# Patient Record
Sex: Male | Born: 2000 | Race: Black or African American | Hispanic: No | Marital: Single | Smoking: Never smoker
Health system: Southern US, Community
[De-identification: ages and names within clinical notes are randomized; demographics above are authoritative.]

## PROBLEM LIST (undated history)

## (undated) DIAGNOSIS — J181 Lobar pneumonia, unspecified organism: Secondary | ICD-10-CM

## (undated) DIAGNOSIS — T7840XA Allergy, unspecified, initial encounter: Secondary | ICD-10-CM

## (undated) DIAGNOSIS — J309 Allergic rhinitis, unspecified: Secondary | ICD-10-CM

## (undated) DIAGNOSIS — K5900 Constipation, unspecified: Secondary | ICD-10-CM

## (undated) HISTORY — DX: Allergic rhinitis, unspecified: J30.9

## (undated) HISTORY — DX: Allergy, unspecified, initial encounter: T78.40XA

## (undated) HISTORY — DX: Lobar pneumonia, unspecified organism: J18.1

## (undated) HISTORY — PX: CIRCUMCISION: SUR203

## (undated) HISTORY — DX: Constipation, unspecified: K59.00

---
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
## 2020-01-14 DIAGNOSIS — Z20822 Contact with and (suspected) exposure to covid-19: Secondary | ICD-10-CM | POA: Diagnosis not present

## 2020-01-16 ENCOUNTER — Ambulatory Visit (INDEPENDENT_AMBULATORY_CARE_PROVIDER_SITE_OTHER): Payer: BC Managed Care – PPO | Admitting: Pediatrics

## 2020-01-16 ENCOUNTER — Other Ambulatory Visit: Payer: Self-pay

## 2020-01-16 DIAGNOSIS — U071 COVID-19: Secondary | ICD-10-CM

## 2020-01-20 ENCOUNTER — Encounter: Payer: Self-pay | Admitting: Pediatrics

## 2020-01-20 NOTE — Progress Notes (Signed)
I connected with  George Haney on 01/20/20 by audio enabled telemedicine application and verified that I am speaking with the correct person using two identifiers.   I discussed the limitations of evaluation and management by telemedicine. The patient expressed understanding and agreed to proceed.  Location: Patient: Home Physician: Office  Subjective:     Patient ID: George Haney, male   DOB: 2001/01/23, 19 y.o.   MRN: [id number]  Chief Complaint  Patient presents with  . Covid positive    HPI: Spoke with the patient who is home with the mother in regards to Covid positive results.  According to the patient, he began to have symptoms of Covid infection including absence of taste and smell on Wednesday of last week.  Mother states that they brought the patient home on Thursday of last week.  She states that he began to have fevers with T-max of 102 on Wednesday.  She states that the patient was tested for Covid on Saturday morning and he was positive.  The results came back as of Sunday.  He was tested at an outside [facility] with a PCR test.  According to the patient, he has had a sore throat and also a headache.  He also continues to have fevers.  Mother states the temperatures are at 101.  She states that she has to give him ibuprofen in order to decrease his fevers.  According to the mother, the Tylenol does not seem to be helping as much.  Denies any shortness of breath.  States his appetite is mildly decreased, however he is drinking well.  When speaking with the patient, he is alert and able to answer all of my questions.  Does not seem to be short of breath during our conversation.  He does play basketball in a college team.  Past Medical History:  Diagnosis Date  . Allergic rhinitis   . Allergy   . Constipation    Miralax prn  . LLL pneumonia 05/18/2006     Family History  Problem Relation Age of Onset  . Diabetes Maternal Grandmother   . Heart disease Maternal  Grandmother   . Hypertension Mother   . Hypertension Maternal Grandfather   . Cancer Paternal Grandmother        renal cell    Social History   Tobacco Use  . Smoking status: Never Smoker  . Smokeless tobacco: Never Used  Substance Use Topics  . Alcohol use: No   Social History   Social History Narrative   Lives at home with mother, father and younger brother.  Attends [employer], 12th grade.  Plays basketball.    Outpatient Encounter Medications as of 01/16/2020  Medication Sig  . Adapalene 0.3 % gel Apply to the affected area once a day sparingly before bedtime.  . meloxicam (MOBIC) 15 MG tablet Take 15 mg by mouth daily.   No facility-administered encounter medications on file as of 01/16/2020.    Patient has no known allergies.    ROS:  Apart from the symptoms reviewed above, there are no other symptoms referable to all systems reviewed.   Physical Examination   Wt Readings from Last 3 Encounters:  01/19/19 133 lb 4 oz (60.4 kg) (24 %, Z= -0.70)*  01/11/18 124 lb 4 oz (56.4 kg) (19 %, Z= -0.88)*  12/16/18 133 lb 8 oz (60.6 kg) (25 %, Z= -0.67)*   * Growth percentiles are based on CDC (Boys, 2-20 Years) data.   BP Readings  from Last 3 Encounters:  01/19/19 118/65  01/11/18 (!) 100/60 (5 %, Z = -1.62 /  20 %, Z = -0.83)*  04/11/15 100/64 (17 %, Z = -0.95 /  54 %, Z = 0.11)*   *BP percentiles are based on the 2017 AAP Clinical Practice Guideline for boys   There is no height or weight on file to calculate BMI. No height and weight on file for this encounter. Blood pressure percentiles are not available for patients who are 18 years or older. Pulse Readings from Last 3 Encounters:  01/19/19 70  01/11/18 70  04/11/15 76       Current Encounter SPO2  04/11/15 0913 99%      Unable to perform examination due to type of visit. No results found for: RAPSCRN   No results found.  No results found for this or any previous visit (from the  past 240 hour(s)).  No results found for this or any previous visit (from the past 48 hour(s)).  Assessment:  1. COVID-19     Plan:   1.  Discussed Covid symptoms at length with mother.  Mother asks if there are any other medications that can be given for headaches and sore throat.  Discussed at length with mother, that the headache and the sore throat are secondary to the Covid infection itself.  Discussed at length with mother, that she may use Tylenol for symptoms of the headache and sore throat.  Otherwise, according to the mother, the patient does well with ibuprofen as the Tylenol does not seem to work as well.  Discussed with her to make sure the patient is well-hydrated. 2.  Patient has not been immunized against coronavirus nor has the family.  Hopefully, the results of the positive Covid testing will be sent to the [facility] department.  However, discussed with mother that she may call the health department as well in order to determine what needs to be done in regards to the rest of the family especially testing and quarantine. 3.  Discussed at length with mother and patient the potential consequences of the coronavirus infection.  If the patient should have any shortness of breath, dizziness, chest pain, etc., he needs to be evaluated in the ER right away.  Discussed multisystem inflammatory response that can also occur after the Covid infection is over.  Also discussed cardiac issues that can be present also secondary to the coronavirus. 4.  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  Therefore discussed with the patient, he needs to discuss this with the coaches and the college as far as what is required for return to play. Spent 20 min on the phone with the patient in regards to discussion of coronavirus infection as well as the possible consequences. Also discussed with mother and patient, that he is 45 years of age and in college.  We had discussed in the past, that  the patient needs to establish care with an adult physician.  Mother understands and will do so. No orders of the defined types were placed in this encounter.

## 2020-01-23 ENCOUNTER — Ambulatory Visit: Payer: Self-pay

## 2020-01-31 ENCOUNTER — Ambulatory Visit: Payer: Self-pay | Admitting: Pediatrics

## 2020-04-26 DIAGNOSIS — Z20822 Contact with and (suspected) exposure to covid-19: Secondary | ICD-10-CM | POA: Diagnosis not present

## 2020-08-20 ENCOUNTER — Ambulatory Visit: Payer: BLUE CROSS/BLUE SHIELD | Admitting: Family Medicine

## 2020-09-12 ENCOUNTER — Other Ambulatory Visit: Payer: Self-pay

## 2020-09-12 ENCOUNTER — Ambulatory Visit: Payer: Medicaid Other | Admitting: Family Medicine

## 2020-09-12 ENCOUNTER — Ambulatory Visit (INDEPENDENT_AMBULATORY_CARE_PROVIDER_SITE_OTHER): Payer: Self-pay | Admitting: Family Medicine

## 2020-09-12 ENCOUNTER — Encounter: Payer: Self-pay | Admitting: Family Medicine

## 2020-09-12 VITALS — BP 100/62 | Ht 69.0 in | Wt 139.2 lb

## 2020-09-12 DIAGNOSIS — N62 Hypertrophy of breast: Secondary | ICD-10-CM

## 2020-09-12 DIAGNOSIS — M7652 Patellar tendinitis, left knee: Secondary | ICD-10-CM | POA: Insufficient documentation

## 2020-09-12 DIAGNOSIS — Z025 Encounter for examination for participation in sport: Secondary | ICD-10-CM | POA: Insufficient documentation

## 2020-09-12 NOTE — Patient Instructions (Signed)
Nice to meet you Please see the endocrinologist   Please send me a message in MyChart with any questions or updates.  Please see me back to start the shockwave therapy .   --Dr. Jordan Likes

## 2020-09-12 NOTE — Assessment & Plan Note (Signed)
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 -Counseled on home exercise therapy and supportive care. -Counseled on patellar strap. -Pursue shockwave

## 2020-09-12 NOTE — Progress Notes (Signed)
o Kaysin Brock - 20 y.o. male MRN [id number]  Date of birth: 11-15-2000  SUBJECTIVE:  Including CC & ROS.  No chief complaint on file.   Kalman Nylen is a 20 y.o. male that is here for a sports physical. He is having some left knee issues and enlarged nipples. The change in his nipples have been occurring for the last month. No supplements or medications. His left knee has been bothering him for some time. The discomfort is intermittent in nature.    Review of Systems See HPI   HISTORY: Past Medical, Surgical, Social, and Family History Reviewed & Updated per EMR.   Pertinent Historical Findings include:  Past Medical History:  Diagnosis Date   Allergic rhinitis    Allergy    Constipation    Miralax prn   LLL pneumonia 05/18/2006    Past Surgical History:  Procedure Laterality Date   CIRCUMCISION      Family History  Problem Relation Age of Onset   Diabetes Maternal Grandmother    Heart disease Maternal Grandmother    Hypertension Mother    Hypertension Maternal Grandfather    Cancer Paternal Grandmother        renal cell    Social History   Socioeconomic History   Marital status: Single    Spouse name: Not on file   Number of children: Not on file   Years of education: Not on file   Highest education level: Not on file  Occupational History   Not on file  Tobacco Use   Smoking status: Never   Smokeless tobacco: Never  Substance and Sexual Activity   Alcohol use: No   Drug use: No   Sexual activity: Never  Other Topics Concern   Not on file  Social History Narrative   Lives at home with mother, father and younger brother.  Attends [employer], 12th grade.  Plays basketball.   Social Determinants of Health   Financial Resource Strain: Not on file  Food Insecurity: Not on file  Transportation Needs: Not on file  Physical Activity: Not on file  Stress: Not on file  Social Connections: Not on file  Intimate Partner Violence: Not on  file     PHYSICAL EXAM:  VS: BP 100/62 (BP Location: Left Arm, Patient Position: Sitting, Cuff Size: Normal)   Ht 5\' 9"  (1.753 m)   Wt 139 lb 4 oz (63.2 kg)   BMI 20.56 kg/m  Physical Exam Gen: NAD, alert, cooperative with exam, well-appearing MSK:  Left knee: Normal strength resistance. No effusion. Neurovascular intact  Limited ultrasound: Left knee:  No effusion suprapatellar pouch. Normal-appearing quadricep tendon. Hypoechoic change in enlargement at the proximal portion of the patellar tendon.   Summary: Patellar tendinitis  Ultrasound and interpretation by , MD    ASSESSMENT & PLAN:   Sports physical Cleared for participation.  Patellar tendinitis of left knee Chronic angering changes in the patellar tendon. -Counseled on home exercise therapy and supportive care. -Counseled on patellar strap. -Pursue shockwave  Gynecomastia Has changes in each nipple and reports discharge.  Denies supplements or any new or different medications. -Referral to endocrinology.

## 2020-09-12 NOTE — Assessment & Plan Note (Signed)
Cleared for participation 

## 2020-09-12 NOTE — Assessment & Plan Note (Signed)
Has changes in each nipple and reports discharge.  Denies supplements or any new or different medications. -Referral to endocrinology.

## 2020-09-25 ENCOUNTER — Other Ambulatory Visit: Payer: Self-pay

## 2020-09-25 ENCOUNTER — Ambulatory Visit: Payer: Self-pay | Admitting: Family Medicine

## 2020-09-25 ENCOUNTER — Encounter: Payer: Self-pay | Admitting: Family Medicine

## 2020-09-25 DIAGNOSIS — M7652 Patellar tendinitis, left knee: Secondary | ICD-10-CM

## 2020-09-25 NOTE — Progress Notes (Signed)
  George Haney - 20 y.o. male MRN [id number]  Date of birth: 2000-09-04  SUBJECTIVE:  Including CC & ROS.  No chief complaint on file.   George Haney is a 20 y.o. male that is presenting for first shockwave.   Review of Systems See HPI   HISTORY: Past Medical, Surgical, Social, and Family History Reviewed & Updated per EMR.   Pertinent Historical Findings include:  Past Medical History:  Diagnosis Date   Allergic rhinitis    Allergy    Constipation    Miralax prn   LLL pneumonia 05/18/2006    Past Surgical History:  Procedure Laterality Date   CIRCUMCISION      Family History  Problem Relation Age of Onset   Diabetes Maternal Grandmother    Heart disease Maternal Grandmother    Hypertension Mother    Hypertension Maternal Grandfather    Cancer Paternal Grandmother        renal cell    Social History   Socioeconomic History   Marital status: Single    Spouse name: Not on file   Number of children: Not on file   Years of education: Not on file   Highest education level: Not on file  Occupational History   Not on file  Tobacco Use   Smoking status: Never   Smokeless tobacco: Never  Substance and Sexual Activity   Alcohol use: No   Drug use: No   Sexual activity: Never  Other Topics Concern   Not on file  Social History Narrative   Lives at home with mother, father and younger brother.  Attends [employer], 12th grade.  Plays basketball.   Social Determinants of Health   Financial Resource Strain: Not on file  Food Insecurity: Not on file  Transportation Needs: Not on file  Physical Activity: Not on file  Stress: Not on file  Social Connections: Not on file  Intimate Partner Violence: Not on file     PHYSICAL EXAM:  VS: Ht 5\' 9"  (1.753 m)   Wt 139 lb (63 kg)   BMI 20.53 kg/m  Physical Exam Gen: NAD, alert, cooperative with exam, well-appearing   ECSWT Note George Haney 2000/08/11  Procedure: ECSWT Indications:  left knee pain  Procedure Details Consent: Risks of procedure as well as the alternatives and risks of each were explained to the (patient/caregiver).  Consent for procedure obtained. Time Out: Verified patient identification, verified procedure, site/side was marked, verified correct patient position, special equipment/implants available, medications/allergies/relevent history reviewed, required imaging and test results available.  Performed.  The area was cleaned with iodine and alcohol swabs.    The left patellar tendon was targeted for Extracorporeal shockwave therapy.   Preset: Patellar tendinopathy Power Level: 80 Frequency: 16 Impulse/cycles: 2500 Head size: Large Session: 1st  Patient did tolerate procedure well.     ASSESSMENT & PLAN:   Patellar tendinitis of left knee Completed shockwave today.

## 2020-09-25 NOTE — Assessment & Plan Note (Signed)
Completed shockwave today. 

## 2020-09-27 DIAGNOSIS — N6452 Nipple discharge: Secondary | ICD-10-CM | POA: Diagnosis not present

## 2020-09-27 DIAGNOSIS — N644 Mastodynia: Secondary | ICD-10-CM | POA: Diagnosis not present

## 2020-10-02 ENCOUNTER — Ambulatory Visit: Payer: Self-pay | Admitting: Family Medicine

## 2020-10-02 ENCOUNTER — Other Ambulatory Visit: Payer: Self-pay

## 2020-10-02 ENCOUNTER — Encounter: Payer: Self-pay | Admitting: Family Medicine

## 2020-10-02 DIAGNOSIS — M7652 Patellar tendinitis, left knee: Secondary | ICD-10-CM

## 2020-10-02 NOTE — Assessment & Plan Note (Signed)
Completed shockwave today. 

## 2020-10-02 NOTE — Progress Notes (Signed)
  George Haney - 20 y.o. male MRN [id number]  Date of birth: 12/06/2000  SUBJECTIVE:  Including CC & ROS.  No chief complaint on file.   George Haney is a 20 y.o. male that is presenting for shockwave   Review of Systems See HPI   HISTORY: Past Medical, Surgical, Social, and Family History Reviewed & Updated per EMR.   Pertinent Historical Findings include:  Past Medical History:  Diagnosis Date   Allergic rhinitis    Allergy    Constipation    Miralax prn   LLL pneumonia 05/18/2006    Past Surgical History:  Procedure Laterality Date   CIRCUMCISION      Family History  Problem Relation Age of Onset   Diabetes Maternal Grandmother    Heart disease Maternal Grandmother    Hypertension Mother    Hypertension Maternal Grandfather    Cancer Paternal Grandmother        renal cell    Social History   Socioeconomic History   Marital status: Single    Spouse name: Not on file   Number of children: Not on file   Years of education: Not on file   Highest education level: Not on file  Occupational History   Not on file  Tobacco Use   Smoking status: Never   Smokeless tobacco: Never  Substance and Sexual Activity   Alcohol use: No   Drug use: No   Sexual activity: Never  Other Topics Concern   Not on file  Social History Narrative   Lives at home with mother, father and younger brother.  Attends [employer], 12th grade.  Plays basketball.   Social Determinants of Health   Financial Resource Strain: Not on file  Food Insecurity: Not on file  Transportation Needs: Not on file  Physical Activity: Not on file  Stress: Not on file  Social Connections: Not on file  Intimate Partner Violence: Not on file     PHYSICAL EXAM:  VS: Ht 5\' 9"  (1.753 m)   Wt 139 lb (63 kg)   BMI 20.53 kg/m  Physical Exam Gen: NAD, alert, cooperative with exam, well-appearing   ECSWT Note George Haney 06-14-2000  Procedure: ECSWT Indications: left knee  pain   Procedure Details Consent: Risks of procedure as well as the alternatives and risks of each were explained to the (patient/caregiver).  Consent for procedure obtained. Time Out: Verified patient identification, verified procedure, site/side was marked, verified correct patient position, special equipment/implants available, medications/allergies/relevent history reviewed, required imaging and test results available.  Performed.  The area was cleaned with iodine and alcohol swabs.    The left patellar tendon was targeted for Extracorporeal shockwave therapy.   Preset: Patellar tendinopathy Power Level: 110 Frequency: 16 Impulse/cycles: 3400 Head size: Medium Session: second  Patient did tolerate procedure well.     ASSESSMENT & PLAN:   Patellar tendinitis of left knee Completed shockwave today.

## 2020-10-09 ENCOUNTER — Encounter: Payer: Self-pay | Admitting: Family Medicine

## 2020-10-09 ENCOUNTER — Other Ambulatory Visit: Payer: Self-pay

## 2020-10-09 ENCOUNTER — Ambulatory Visit: Payer: Self-pay | Admitting: Family Medicine

## 2020-10-09 DIAGNOSIS — M7652 Patellar tendinitis, left knee: Secondary | ICD-10-CM

## 2020-10-09 NOTE — Progress Notes (Signed)
  Jacobi Nile - 20 y.o. male MRN [id number]  Date of birth: 2000/04/30  SUBJECTIVE:  Including CC & ROS.  Chief Complaint  Patient presents with   Follow-up    3rd shockwave treatment of L knee     Ronold Hardgrove is a 20 y.o. male that is here for shockwave therapy.   Review of Systems See HPI   HISTORY: Past Medical, Surgical, Social, and Family History Reviewed & Updated per EMR.   Pertinent Historical Findings include:  Past Medical History:  Diagnosis Date   Allergic rhinitis    Allergy    Constipation    Miralax prn   LLL pneumonia 05/18/2006    Past Surgical History:  Procedure Laterality Date   CIRCUMCISION      Family History  Problem Relation Age of Onset   Diabetes Maternal Grandmother    Heart disease Maternal Grandmother    Hypertension Mother    Hypertension Maternal Grandfather    Cancer Paternal Grandmother        renal cell    Social History   Socioeconomic History   Marital status: Single    Spouse name: Not on file   Number of children: Not on file   Years of education: Not on file   Highest education level: Not on file  Occupational History   Not on file  Tobacco Use   Smoking status: Never   Smokeless tobacco: Never  Substance and Sexual Activity   Alcohol use: No   Drug use: No   Sexual activity: Never  Other Topics Concern   Not on file  Social History Narrative   Lives at home with mother, father and younger brother.  Attends [employer], 12th grade.  Plays basketball.   Social Determinants of Health   Financial Resource Strain: Not on file  Food Insecurity: Not on file  Transportation Needs: Not on file  Physical Activity: Not on file  Stress: Not on file  Social Connections: Not on file  Intimate Partner Violence: Not on file     PHYSICAL EXAM:  VS: BP 100/70   Pulse 60   Ht 5\' 9"  (1.753 m)   Wt 139 lb (63 kg)   SpO2 98%   BMI 20.53 kg/m  Physical Exam Gen: NAD, alert, cooperative with  exam, well-appearing   ECSWT Note Donell Tomkins 2000/06/09  Procedure: ECSWT Indications: left knee pain   Procedure Details Consent: Risks of procedure as well as the alternatives and risks of each were explained to the (patient/caregiver).  Consent for procedure obtained. Time Out: Verified patient identification, verified procedure, site/side was marked, verified correct patient position, special equipment/implants available, medications/allergies/relevent history reviewed, required imaging and test results available.  Performed.  The area was cleaned with iodine and alcohol swabs.    The left patellar tendon was targeted for Extracorporeal shockwave therapy.   Preset: Patellar tendinopathy Power Level: 120 Frequency: 10 Impulse/cycles: 3800 Head size: Medium Session: 3rd  Patient did tolerate procedure well.     ASSESSMENT & PLAN:   Patellar tendinitis of left knee Completed shockwave today

## 2020-10-09 NOTE — Assessment & Plan Note (Signed)
Completed shockwave today. 

## 2020-10-15 NOTE — Progress Notes (Signed)
  Harish Bram - 20 y.o. male MRN [id number]  Date of birth: 10-22-2000  SUBJECTIVE:  Including CC & ROS.  No chief complaint on file.   Pranit Owensby is a 20 y.o. male that is presenting for shockwave therapy.    Review of Systems See HPI   HISTORY: Past Medical, Surgical, Social, and Family History Reviewed & Updated per EMR.   Pertinent Historical Findings include:  Past Medical History:  Diagnosis Date   Allergic rhinitis    Allergy    Constipation    Miralax prn   LLL pneumonia 05/18/2006    Past Surgical History:  Procedure Laterality Date   CIRCUMCISION      Family History  Problem Relation Age of Onset   Diabetes Maternal Grandmother    Heart disease Maternal Grandmother    Hypertension Mother    Hypertension Maternal Grandfather    Cancer Paternal Grandmother        renal cell    Social History   Socioeconomic History   Marital status: Single    Spouse name: Not on file   Number of children: Not on file   Years of education: Not on file   Highest education level: Not on file  Occupational History   Not on file  Tobacco Use   Smoking status: Never   Smokeless tobacco: Never  Substance and Sexual Activity   Alcohol use: No   Drug use: No   Sexual activity: Never  Other Topics Concern   Not on file  Social History Narrative   Lives at home with mother, father and younger brother.  Attends [employer], 12th grade.  Plays basketball.   Social Determinants of Health   Financial Resource Strain: Not on file  Food Insecurity: Not on file  Transportation Needs: Not on file  Physical Activity: Not on file  Stress: Not on file  Social Connections: Not on file  Intimate Partner Violence: Not on file     PHYSICAL EXAM:  VS: Ht 5\' 9"  (1.753 m)   Wt 139 lb (63 kg)   BMI 20.53 kg/m  Physical Exam Gen: NAD, alert, cooperative with exam, well-appearing   ECSWT Note Asia Favata 08-20-00  Procedure: ECSWT Indications:  left knee pain   Procedure Details Consent: Risks of procedure as well as the alternatives and risks of each were explained to the (patient/caregiver).  Consent for procedure obtained. Time Out: Verified patient identification, verified procedure, site/side was marked, verified correct patient position, special equipment/implants available, medications/allergies/relevent history reviewed, required imaging and test results available.  Performed.  The area was cleaned with iodine and alcohol swabs.    The left patellar tendon was targeted for Extracorporeal shockwave therapy.   Preset: Patellar tendinopathy Power Level: 130 Frequency: 10 Impulse/cycles: 4200 Head size: Medium Session: Fourth  Patient did tolerate procedure well.     ASSESSMENT & PLAN:   Patellar tendinitis of left knee Completed shockwave today.

## 2020-10-16 ENCOUNTER — Ambulatory Visit: Payer: Self-pay | Admitting: Family Medicine

## 2020-10-16 ENCOUNTER — Other Ambulatory Visit: Payer: Self-pay

## 2020-10-16 ENCOUNTER — Encounter: Payer: Self-pay | Admitting: Family Medicine

## 2020-10-16 DIAGNOSIS — M7652 Patellar tendinitis, left knee: Secondary | ICD-10-CM

## 2020-10-16 NOTE — Assessment & Plan Note (Signed)
Completed shockwave today. 

## 2020-10-23 ENCOUNTER — Other Ambulatory Visit: Payer: Self-pay

## 2020-10-23 ENCOUNTER — Encounter: Payer: Self-pay | Admitting: Family Medicine

## 2020-10-23 ENCOUNTER — Ambulatory Visit: Payer: Self-pay | Admitting: Family Medicine

## 2020-10-23 DIAGNOSIS — M7652 Patellar tendinitis, left knee: Secondary | ICD-10-CM

## 2020-10-23 NOTE — Progress Notes (Signed)
  George Haney - 20 y.o. male MRN [id number]  Date of birth: 07/04/2000  SUBJECTIVE:  Including CC & ROS.  No chief complaint on file.   George Haney is a 20 y.o. male that is presenting for shockwave therapy.    Review of Systems See HPI   HISTORY: Past Medical, Surgical, Social, and Family History Reviewed & Updated per EMR.   Pertinent Historical Findings include:  Past Medical History:  Diagnosis Date   Allergic rhinitis    Allergy    Constipation    Miralax prn   LLL pneumonia 05/18/2006    Past Surgical History:  Procedure Laterality Date   CIRCUMCISION      Family History  Problem Relation Age of Onset   Diabetes Maternal Grandmother    Heart disease Maternal Grandmother    Hypertension Mother    Hypertension Maternal Grandfather    Cancer Paternal Grandmother        renal cell    Social History   Socioeconomic History   Marital status: Single    Spouse name: Not on file   Number of children: Not on file   Years of education: Not on file   Highest education level: Not on file  Occupational History   Not on file  Tobacco Use   Smoking status: Never   Smokeless tobacco: Never  Substance and Sexual Activity   Alcohol use: No   Drug use: No   Sexual activity: Never  Other Topics Concern   Not on file  Social History Narrative   Lives at home with mother, father and younger brother.  Attends [employer], 12th grade.  Plays basketball.   Social Determinants of Health   Financial Resource Strain: Not on file  Food Insecurity: Not on file  Transportation Needs: Not on file  Physical Activity: Not on file  Stress: Not on file  Social Connections: Not on file  Intimate Partner Violence: Not on file     PHYSICAL EXAM:  VS: Ht 5\' 9"  (1.753 m)   Wt 139 lb (63 kg)   BMI 20.53 kg/m  Physical Exam Gen: NAD, alert, cooperative with exam, well-appearing   ECSWT Note George Haney 04/15/00  Procedure: ECSWT Indications:  left knee pain   Procedure Details Consent: Risks of procedure as well as the alternatives and risks of each were explained to the (patient/caregiver).  Consent for procedure obtained. Time Out: Verified patient identification, verified procedure, site/side was marked, verified correct patient position, special equipment/implants available, medications/allergies/relevent history reviewed, required imaging and test results available.  Performed.  The area was cleaned with iodine and alcohol swabs.    The left patellar tendon was targeted for Extracorporeal shockwave therapy.   Preset: Patellar tendinopathy Power Level: 140 Frequency: 10 Impulse/cycles: 4400 Head size: Medium Session: 5th  Patient did tolerate procedure well.     ASSESSMENT & PLAN:   Patellar tendinitis of left knee Completed shockwave today.

## 2020-10-23 NOTE — Assessment & Plan Note (Signed)
Completed shockwave today. 

## 2020-10-30 ENCOUNTER — Ambulatory Visit: Payer: Self-pay | Admitting: Family Medicine

## 2020-10-30 ENCOUNTER — Other Ambulatory Visit: Payer: Self-pay

## 2020-10-30 ENCOUNTER — Encounter: Payer: Self-pay | Admitting: Family Medicine

## 2020-10-30 DIAGNOSIS — M7652 Patellar tendinitis, left knee: Secondary | ICD-10-CM

## 2020-10-30 NOTE — Assessment & Plan Note (Signed)
Completed shockwave therapy today.  

## 2020-10-30 NOTE — Progress Notes (Signed)
  George Haney - 20 y.o. male MRN [id number]  Date of birth: Jan 30, 2001  SUBJECTIVE:  Including CC & ROS.  No chief complaint on file.   George Haney is a 20 y.o. male that is here for shockwave therapy.    Review of Systems See HPI   HISTORY: Past Medical, Surgical, Social, and Family History Reviewed & Updated per EMR.   Pertinent Historical Findings include:  Past Medical History:  Diagnosis Date   Allergic rhinitis    Allergy    Constipation    Miralax prn   LLL pneumonia 05/18/2006    Past Surgical History:  Procedure Laterality Date   CIRCUMCISION      Family History  Problem Relation Age of Onset   Diabetes Maternal Grandmother    Heart disease Maternal Grandmother    Hypertension Mother    Hypertension Maternal Grandfather    Cancer Paternal Grandmother        renal cell    Social History   Socioeconomic History   Marital status: Single    Spouse name: Not on file   Number of children: Not on file   Years of education: Not on file   Highest education level: Not on file  Occupational History   Not on file  Tobacco Use   Smoking status: Never   Smokeless tobacco: Never  Substance and Sexual Activity   Alcohol use: No   Drug use: No   Sexual activity: Never  Other Topics Concern   Not on file  Social History Narrative   Lives at home with mother, father and younger brother.  Attends [employer], 12th grade.  Plays basketball.   Social Determinants of Health   Financial Resource Strain: Not on file  Food Insecurity: Not on file  Transportation Needs: Not on file  Physical Activity: Not on file  Stress: Not on file  Social Connections: Not on file  Intimate Partner Violence: Not on file     PHYSICAL EXAM:  VS: Ht 5\' 9"  (1.753 m)   Wt 139 lb (63 kg)   BMI 20.53 kg/m  Physical Exam Gen: NAD, alert, cooperative with exam, well-appearing   ECSWT Note George Haney [birth year]-18[birth year]00  Procedure: ECSWT Indications: left  knee pain   Procedure Details Consent: Risks of procedure as well as the alternatives and risks of each were explained to the (patient/caregiver).  Consent for procedure obtained. Time Out: Verified patient identification, verified procedure, site/side was marked, verified correct patient position, special equipment/implants available, medications/allergies/relevent history reviewed, required imaging and test results available.  Performed.  The area was cleaned with iodine and alcohol swabs.    The left patellar tendon was targeted for Extracorporeal shockwave therapy.   Preset: Patellar tendinopathy Power Level: 140 Frequency: 10 Impulse/cycles: 4800 Head size: Medium Session: 6th  Patient did tolerate procedure well.     ASSESSMENT & PLAN:   Patellar tendinitis of left knee Completed shockwave therapy today.

## 2020-11-02 ENCOUNTER — Other Ambulatory Visit: Payer: Self-pay

## 2020-11-02 ENCOUNTER — Ambulatory Visit (INDEPENDENT_AMBULATORY_CARE_PROVIDER_SITE_OTHER): Payer: BC Managed Care – PPO | Admitting: Endocrinology

## 2020-11-02 VITALS — BP 100/68 | HR 99 | Ht 72.0 in | Wt 136.8 lb

## 2020-11-02 DIAGNOSIS — N62 Hypertrophy of breast: Secondary | ICD-10-CM

## 2020-11-02 NOTE — Progress Notes (Signed)
Subjective:    Patient ID: George Haney, male    DOB: 04/06/2000, 20 y.o.   MRN: [id number]  HPI Some hx is provided by pt's mother, due to importance of pediatric hx.  Pt is referred by Dr Jordan Likes, for gynecomastia.  Pt was the product of a normal pregnancy and delivery.  he had puberty at age 46.  He has biological children. He has never been diagnosed with hypogonadism.  He denies any h/o infertility.  He has never had liver disease, kidney disease, cancer, cystic fibrosis, ulcerative colitis, or alcoholism. He has never taken cimetidine, calcium channel blockers, growth hormone, risperidone, hCG, opioids, androgens, 5-alpha-reductase inhibitors, cancer chemotherapy, estrogens, or ketoconazole.  He now reports 6 weeks of moderate swelling at both breasts.  He also has bilat nipple pain, swelling, itching, and bloody d/c.  He will go back to college in [place], [place] in 5 days.   Past Medical History:  Diagnosis Date   Allergic rhinitis    Allergy    Constipation    Miralax prn   LLL pneumonia 05/18/2006    Past Surgical History:  Procedure Laterality Date   CIRCUMCISION      Social History   Socioeconomic History   Marital status: Single    Spouse name: Not on file   Number of children: Not on file   Years of education: Not on file   Highest education level: Not on file  Occupational History   Not on file  Tobacco Use   Smoking status: Never   Smokeless tobacco: Never  Substance and Sexual Activity   Alcohol use: No   Drug use: No   Sexual activity: Never  Other Topics Concern   Not on file  Social History Narrative   Lives at home with mother, father and younger brother.  Attends [employer], 12th grade.  Plays basketball.   Social Determinants of Health   Financial Resource Strain: Not on file  Food Insecurity: Not on file  Transportation Needs: Not on file  Physical Activity: Not on file  Stress: Not on file  Social Connections: Not on file   Intimate Partner Violence: Not on file    Current Outpatient Medications on File Prior to Visit  Medication Sig Dispense Refill   mupirocin ointment (BACTROBAN) 2 % APPLY TO AFFECTED AREA TWICE A DAY FOR 7 DAYS     predniSONE (DELTASONE) 5 MG tablet PLEASE SEE ATTACHED FOR DETAILED DIRECTIONS     Adapalene 0.3 % gel Apply to the affected area once a day sparingly before bedtime. (Patient not taking: Reported on 11/02/2020) 45 g 0   meloxicam (MOBIC) 15 MG tablet Take 15 mg by mouth daily. (Patient not taking: Reported on 11/02/2020)     No current facility-administered medications on file prior to visit.    No Known Allergies  Family History  Problem Relation Age of Onset   Diabetes Maternal Grandmother    Heart disease Maternal Grandmother    Hypertension Mother    Hypertension Maternal Grandfather    Cancer Paternal Grandmother        renal cell    BP 100/68 (BP Location: Right Arm, Patient Position: Sitting, Cuff Size: Normal)   Pulse 99   Ht 6' (1.829 m)   Wt 136 lb 12.8 oz (62.1 kg)   SpO2 96%   BMI 18.55 kg/m    Review of Systems denies depression, numbness, erectile dysfunction, weight change, muscle weakness, headache, and sob.     Objective:  Physical Exam VS: see vs page GEN: no distress HEAD: head: no deformity eyes: no periorbital swelling, no proptosis external nose and ears are normal NECK: supple, thyroid is not enlarged CHEST WALL: no deformity LUNGS: clear to auscultation BREASTS:  No gynecomastia, but nipples are large.   CV: reg rate and rhythm, no murmur GENITALIA:  Normal male.   MUSCULOSKELETAL: muscle bulk and strength are grossly normal.  no joint swelling is seen  gait is normal and steady.    EXTEMITIES: no leg edema NEURO: sensation is intact to touch on all 4's.  SKIN:  Normal texture and temperature.  No rash or suspicious lesion is visible.  Normal male hair distribution. NODES:  None palpable at the neck PSYCH: alert,  well-oriented.  Does not appear anxious nor depressed.    Lab Results  Component Value Date   TSH 0.73 11/02/2020      Assessment & Plan:  Gynecomastia vs nipple eczema, new to me, uncertain etiology and prognosis.  I rx'ed TAC cream  Patient Instructions  Blood tests are requested for you today.  We'll let you know about the results.   Let's recheck mammograms of both sides.  you will receive a phone call, about a day and time for an appointment.

## 2020-11-02 NOTE — Patient Instructions (Addendum)
Blood tests are requested for you today.  We'll let you know about the results.   Let's recheck mammograms of both sides.  you will receive a phone call, about a day and time for an appointment.

## 2020-11-03 ENCOUNTER — Telehealth: Payer: Self-pay | Admitting: Endocrinology

## 2020-11-03 MED ORDER — TRIAMCINOLONE ACETONIDE 0.1 % EX CREA: 1.0000 "application " | TOPICAL_CREAM | Freq: Four times a day (QID) | CUTANEOUS | 1 refills | Status: DC

## 2020-11-03 NOTE — Telephone Encounter (Signed)
please contact patient: The hormone tests are normal so far.  I was listening to what you were saying when you were here about your symptoms.  I have sent a prescription to your pharmacy, for a prescription to help the symptoms.  This works better when the areas are covered with large bandaids.

## 2020-11-05 NOTE — Telephone Encounter (Signed)
Message sent thru MyChart 

## 2020-11-07 LAB — TESTOSTERONE,FREE AND TOTAL
Testosterone, Free: 15.5 pg/mL
Testosterone: 733 ng/dL (ref 150–785)

## 2020-11-09 LAB — BASIC METABOLIC PANEL
BUN/Creatinine Ratio: 15 (calc) (ref 6–22)
BUN: 22 mg/dL — ABNORMAL HIGH (ref 7–20)
CO2: 27 mmol/L (ref 20–32)
Calcium: 10.2 mg/dL (ref 8.9–10.4)
Chloride: 103 mmol/L (ref 98–110)
Creat: 1.45 mg/dL — ABNORMAL HIGH (ref 0.60–1.24)
Glucose, Bld: 71 mg/dL (ref 65–99)
Potassium: 4.5 mmol/L (ref 3.8–5.1)
Sodium: 140 mmol/L (ref 135–146)

## 2020-11-09 LAB — ESTRADIOL, FREE
Estradiol, Free: 0.81 pg/mL — ABNORMAL HIGH
Estradiol: 38 pg/mL — ABNORMAL HIGH (ref <=29)

## 2020-11-09 LAB — HEPATIC FUNCTION PANEL
AG Ratio: 1.8 (calc) (ref 1.0–2.5)
ALT: 13 U/L (ref 8–46)
AST: 21 U/L (ref 12–32)
Albumin: 5.3 g/dL — ABNORMAL HIGH (ref 3.6–5.1)
Alkaline phosphatase (APISO): 106 U/L (ref 46–169)
Bilirubin, Direct: 0.2 mg/dL (ref 0.0–0.2)
Globulin: 3 g/dL (calc) (ref 2.1–3.5)
Indirect Bilirubin: 0.8 mg/dL (calc) (ref 0.2–1.1)
Total Bilirubin: 1 mg/dL (ref 0.2–1.1)
Total Protein: 8.3 g/dL — ABNORMAL HIGH (ref 6.3–8.2)

## 2020-11-09 LAB — PROLACTIN: Prolactin: 7.4 ng/mL (ref 2.0–18.0)

## 2020-11-09 LAB — HCG, TOTAL, QUANTITATIVE: hCG, Beta Chain, Quant, S: <3 m[IU]/mL (ref <5)

## 2020-11-09 LAB — LUTEINIZING HORMONE: LH: 4.9 m[IU]/mL (ref 1.5–9.3)

## 2020-11-09 LAB — FOLLICLE STIMULATING HORMONE: FSH: 5.1 m[IU]/mL (ref 1.6–8.0)

## 2020-11-09 LAB — T4, FREE: Free T4: 1.2 ng/dL (ref 0.8–1.4)

## 2020-11-09 LAB — TSH: TSH: 0.73 mIU/L (ref 0.50–4.30)

## 2022-06-30 ENCOUNTER — Encounter: Payer: Self-pay | Admitting: *Deleted

## 2022-08-26 ENCOUNTER — Telehealth: Payer: Self-pay

## 2022-08-26 NOTE — Telephone Encounter (Signed)
Cala Bradford, mother of George Haney called to schedule a NP appt. Please schedule him. The number is [phone]. Thank you!

## 2022-09-03 ENCOUNTER — Ambulatory Visit (INDEPENDENT_AMBULATORY_CARE_PROVIDER_SITE_OTHER): Payer: Self-pay | Admitting: Dermatology

## 2022-09-03 ENCOUNTER — Encounter: Payer: Self-pay | Admitting: Dermatology

## 2022-09-03 VITALS — BP 115/76

## 2022-09-03 DIAGNOSIS — L700 Acne vulgaris: Secondary | ICD-10-CM

## 2022-09-03 DIAGNOSIS — R21 Rash and other nonspecific skin eruption: Secondary | ICD-10-CM

## 2022-09-03 DIAGNOSIS — L209 Atopic dermatitis, unspecified: Secondary | ICD-10-CM

## 2022-09-03 DIAGNOSIS — L308 Other specified dermatitis: Secondary | ICD-10-CM

## 2022-09-03 DIAGNOSIS — L299 Pruritus, unspecified: Secondary | ICD-10-CM

## 2022-09-03 HISTORY — DX: Atopic dermatitis, unspecified: L20.9

## 2022-09-03 MED ORDER — TRIAMCINOLONE ACETONIDE 0.1 % EX CREA: 1.0000 | TOPICAL_CREAM | Freq: Two times a day (BID) | CUTANEOUS | 0 refills | Status: DC

## 2022-09-03 MED ORDER — TRETINOIN 0.025 % EX CREA: TOPICAL_CREAM | Freq: Every evening | CUTANEOUS | 2 refills | Status: DC

## 2022-09-03 NOTE — Progress Notes (Unsigned)
   Follow-Up Visit   Subjective  George Haney is a 22 y.o. male who presents for the following: No chief complaint on file..  ***  The following portions of the chart were reviewed this encounter and updated as appropriate:      {Review of Systems:34166::"No other skin or systemic complaints."}  Objective  Well appearing patient in no apparent distress; mood and affect are within normal limits.  {Exam:34163::"A full examination was performed including scalp, head, eyes, ears, nose, lips, neck, chest, axillae, abdomen, back, buttocks, bilateral upper extremities, bilateral lower extremities, hands, feet, fingers, toes, fingernails, and toenails. All findings within normal limits unless otherwise noted below."}  Mid Back Hyperpigmented flat topped papules coalescing  into plaques involving trunk and upper  and lower extremities      Assessment & Plan  Rash of body Left Upper Back  Rash and other nonspecific skin eruption Mid Back  Skin / nail biopsy - Mid Back Type of biopsy: punch   Informed consent: discussed and consent obtained   Timeout: patient name, date of birth, surgical site, and procedure verified   Procedure prep:  Patient was prepped and draped in usual sterile fashion Prep type:  Isopropyl alcohol Anesthesia: the lesion was anesthetized in a standard fashion   Anesthetic:  1% lidocaine w/ epinephrine 1-100,000 buffered w/ 8.4% NaHCO3 Punch size:  4 mm Suture size:  4-0 Hemostasis achieved with: suture, pressure and aluminum chloride   Outcome: patient tolerated procedure well   Post-procedure details: sterile dressing applied and wound care instructions given   Dressing type: petrolatum gauze and bandage     Return in about 2 weeks (around 09/17/2022) for Suture Removal.

## 2022-09-03 NOTE — Patient Instructions (Addendum)
Daily Skincare Regimen: Patient Handout  Morning Routine:  Cleanse: Start your day by washing your face with a gentle cleanser. Choose a product suitable for your skin type, such as CeraVe, Neutrogena, Cetaphil, or LaRoche-Posay. Gently massage the cleanser onto damp skin, then rinse thoroughly with lukewarm water and pat dry with a clean towel. Use Panoxyl wash every morning   Moisturize: Finish your morning routine by applying a moisturizer to your face and neck. Opt for a moisturizer that has SPF included, is suitable for your skin type and provides hydration without clogging pores. Consider brands like CeraVe, Neutrogena, Cetaphil, or LaRoche-Posay for effective hydration and skin barrier support.  Evening Routine:  Cleanse: Before bed, cleanse your face again with a gentle cleanser to remove makeup, dirt, and impurities accumulated throughout the day. Use the same cleanser as in the morning and follow the same steps for cleansing.  Apply Medication: Ensure that your skin is completely dry before applying any topical treatments to maximize their efficacy.  Apply prescription medication Tretinoin cream on Monday, Wednesday, and Friday evenings. Apply a light moisturizer before and after application.   Moisturize: After applying any medications, moisturize your skin to seal in hydration and support skin barrier function. Choose a moisturizer suitable for nighttime use that helps replenish moisture overnight. Look for products from trusted brands like CeraVe, Neutrogena, Cetaphil, or LaRoche-Posay for optimal results.   Additional Tips:  Sun Protection: During the daytime, it is essential to apply a broad-spectrum sunscreen with an SPF of 30 or higher to protect your skin from harmful UV rays. Apply sunscreen as the last step of your morning skincare routine and reapply throughout the day as needed, especially if you will be spending  time outdoors.  Hydration: Drink plenty of water throughout the day to keep your skin hydrated from within.  Healthy Lifestyle: Maintain a balanced diet, get regular exercise, manage stress, and prioritize adequate sleep to support overall skin health.   Following a consistent daily skincare regimen can help promote healthy, radiant skin and minimize the risk of common skin issues. Be patient and consistent with your routine, and remember to listen to your skin's needs     Patient Handout: Wound Care for Skin Biopsy Site  Patient Handout: Wound Care for Skin Biopsy Site  Taking Care of Your Skin Biopsy Site  Proper care of the biopsy site is essential for promoting healing and minimizing scarring. This handout provides instructions on how to care for your biopsy site to ensure optimal recovery.  1. Cleaning the Wound:  Clean the biopsy site daily with gentle soap and water. Gently pat the area dry with a clean, soft towel. Avoid harsh scrubbing or rubbing the area, as this can irritate the skin and delay healing.  2. Applying Aquaphor and Bandage:  After cleaning the wound, apply a thin layer of Aquaphor ointment to the biopsy site. Cover the area with a sterile bandage to protect it from dirt, bacteria, and friction. Change the bandage daily or as needed if it becomes soiled or wet.  3. Continued Care for One Week:  Repeat the cleaning, Aquaphor application, and bandaging process daily for one week following the biopsy procedure. Keeping the wound clean and moist during this initial healing period will help prevent infection and promote optimal healing.  4. Massaging Aquaphor into the Area:  ---After one week, discontinue the use of bandages but continue to apply Aquaphor to the biopsy site. ----Gently massage the Aquaphor into the area using circular  motions. ---Massaging the skin helps to promote circulation and prevent the formation of scar tissue.   Additional  Tips:  Avoid exposing the biopsy site to direct sunlight during the healing process, as this can cause hyperpigmentation or worsen scarring. If you experience any signs of infection, such as increased redness, swelling, warmth, or drainage from the wound, contact your healthcare provider immediately. Follow any additional instructions provided by your healthcare provider for caring for the biopsy site and managing any discomfort. Conclusion:  Taking proper care of your skin biopsy site is crucial for ensuring optimal healing and minimizing scarring. By following these instructions for cleaning, applying Aquaphor, and massaging the area, you can promote a smooth and successful recovery. If you have any questions or concerns about caring for your biopsy site, don't hesitate to contact your healthcare provider for guidance.     Due to recent changes in healthcare laws, you may see results of your pathology and/or laboratory studies on MyChart before the doctors have had a chance to review them. We understand that in some cases there may be results that are confusing or concerning to you. Please understand that not all results are received at the same time and often the doctors may need to interpret multiple results in order to provide you with the best plan of care or course of treatment. Therefore, we ask that you please give [place] 2 business days to thoroughly review all your results before contacting the office for clarification. Should we see a critical lab result, you will be contacted sooner.   If You Need Anything After Your Visit  If you have any questions or concerns for your doctor, please call our main line at [phone] If no one answers, please leave a voicemail as directed and we will return your call as soon as possible. Messages left after 4 pm will be answered the following business day.   You may also send [place] a message via MyChart. We typically respond to MyChart messages within 1-2  business days.  For prescription refills, please ask your pharmacy to contact our office. Our fax number is [phone].  If you have an urgent issue when the clinic is closed that cannot wait until the next business day, you can page your doctor at the number below.    Please note that while we do our best to be available for urgent issues outside of office hours, we are not available 24/7.   If you have an urgent issue and are unable to reach [place], you may choose to seek medical care at your doctor's office, retail clinic, urgent care center, or emergency room.  If you have a medical emergency, please immediately call 911 or go to the emergency department. In the event of inclement weather, please call our main line at [phone] for an update on the status of any delays or closures.  Dermatology Medication Tips: Please keep the boxes that topical medications come in in order to help keep track of the instructions about where and how to use these. Pharmacies typically print the medication instructions only on the boxes and not directly on the medication tubes.   If your medication is too expensive, please contact our office at [phone] or send [place] a message through MyChart.   We are unable to tell what your co-pay for medications will be in advance as this is different depending on your insurance coverage. However, we may be able to find a substitute medication at lower cost or fill  out paperwork to get insurance to cover a needed medication.   If a prior authorization is required to get your medication covered by your insurance company, please allow [place] 1-2 business days to complete this process.  Drug prices often vary depending on where the prescription is filled and some pharmacies may offer cheaper prices.  The website www.goodrx.com contains coupons for medications through different pharmacies. The prices here do not account for what the cost may be with help from insurance (it may  be cheaper with your insurance), but the website can give you the price if you did not use any insurance.  - You can print the associated coupon and take it with your prescription to the pharmacy.  - You may also stop by our office during regular business hours and pick up a GoodRx coupon card.  - If you need your prescription sent electronically to a different pharmacy, notify our office through [facility] or by phone at [phone]

## 2022-09-03 NOTE — Progress Notes (Unsigned)
New Patient Visit   Subjective  George Haney is a 22 y.o. male who presents for the following: Rash On the neck, extremities, and torso x 3 months. It has become worse in the past 3 weeks. He was in the Urgent Care 1 month and he was given Prednisone x 10 days, IM Steroid injection, and TAC ointment.  He has no history of Eczema. It started on the chest. It was a 3 on itch scale when it started. It started to clear up after the treatment from urgent care.  He was in the ER this past Sunday due to itchy and was given Hydroxyzine 25 mg. Aveeno burns. It is a 8.5 on itch scale He ran out of TAC ointment 3 weeks ago. New areas are on neck, hands, and back  The following portions of the chart were reviewed this encounter and updated as appropriate: medications, allergies, medical history  Review of Systems:  No other skin or systemic complaints except as noted in HPI or Assessment and Plan.  Objective  Well appearing patient in no apparent distress; mood and affect are within normal limits.  A focused examination was performed of the following areas: Neck, torso, extremities  Relevant exam findings are noted in the Assessment and Plan.  Mid Back, Right Thigh - Anterior Hyperpigmented flat topped papules coalescing  into plaques involving trunk and upper and lower extremities         Assessment & Plan   Rash and other nonspecific skin eruption Right Thigh - Anterior; Mid Back  Skin / nail biopsy - Mid Back Type of biopsy: punch   Informed consent: discussed and consent obtained   Timeout: patient name, date of birth, surgical site, and procedure verified   Procedure prep:  Patient was prepped and draped in usual sterile fashion Prep type:  Isopropyl alcohol Anesthesia: the lesion was anesthetized in a standard fashion   Anesthetic:  1% lidocaine w/ epinephrine 1-100,000 buffered w/ 8.4% NaHCO3 Punch size:  4 mm Suture size:  4-0 Suture type: nylon   Suture removal  (days):  14 Hemostasis achieved with: suture, pressure and aluminum chloride   Outcome: patient tolerated procedure well   Post-procedure details: sterile dressing applied and wound care instructions given   Dressing type: petrolatum gauze and bandage    Skin / nail biopsy - Right Thigh - Anterior Type of biopsy: punch   Informed consent: discussed and consent obtained   Timeout: patient name, date of birth, surgical site, and procedure verified   Procedure prep:  Patient was prepped and draped in usual sterile fashion (the patient was cleaned and prepped) Prep type:  Isopropyl alcohol Anesthesia: the lesion was anesthetized in a standard fashion   Anesthetic:  1% lidocaine w/ epinephrine 1-100,000 buffered w/ 8.4% NaHCO3 Punch size:  4 mm Suture size:  4-0 Suture type: nylon   Suture removal (days):  14 Hemostasis achieved with: suture and pressure   Outcome: patient tolerated procedure well   Post-procedure details: sterile dressing applied and wound care instructions given   Dressing type: bandage, petrolatum and pressure dressing    Specimen 2 - Surgical pathology Differential Diagnosis: Eczema vs Psoriasis vs CTCL vs Pityriasis rubra pilaris  Check Margins: No    Pruritus Exam: ***   Treatment Plan: 2 Biopsies will be done today. Triamcinolone cream twice daily  ACNE VULGARIS Exam: Open comedones and inflammatory papules on the face  Flared  Treatment Plan: Panoxyl  cleanser in the morning Cerave lotion daily  before and after using Tretinoin cream 0.025% cream Mon, Wed, and Fri evenings      Return in about 2 weeks (around 09/17/2022) for Suture Removal.  I, Mosetta Anis, CMA, am acting as scribe for Langston Reusing, DO.   Documentation: I have reviewed the above documentation for accuracy and completeness, and I agree with the above.  Langston Reusing, DO

## 2022-09-04 LAB — LAB REPORT - SCANNED: HM HIV Screening: NEGATIVE

## 2022-09-08 NOTE — Progress Notes (Signed)
Biopsy results reveal a dx of atopic dermatitis.    Pt is currently on appropriate topical therapy and I will reassess the need for systemic tx  at the [facility] / Follow Up visit.

## 2022-09-22 ENCOUNTER — Ambulatory Visit: Payer: BC Managed Care – PPO | Admitting: Dermatology

## 2022-09-22 ENCOUNTER — Encounter: Payer: Self-pay | Admitting: Dermatology

## 2022-09-22 DIAGNOSIS — L810 Postinflammatory hyperpigmentation: Secondary | ICD-10-CM

## 2022-09-22 DIAGNOSIS — L700 Acne vulgaris: Secondary | ICD-10-CM | POA: Diagnosis not present

## 2022-09-22 DIAGNOSIS — L2089 Other atopic dermatitis: Secondary | ICD-10-CM | POA: Diagnosis not present

## 2022-09-22 MED ORDER — DOXYCYCLINE HYCLATE 100 MG PO TABS
100.00 mg | ORAL_TABLET | Freq: Every day | ORAL | 3 refills | Status: AC
Start: 2022-09-22 — End: 2023-01-20

## 2022-09-22 MED ORDER — CLINDAMYCIN PHOSPHATE 1 % EX SWAB
1.0000 | Freq: Two times a day (BID) | CUTANEOUS | 3 refills | Status: DC
Start: 2022-09-22 — End: 2023-05-30

## 2022-09-22 NOTE — Patient Instructions (Addendum)
Thank you for visiting my office today. I appreciate your dedication to improving your health and managing your dermatological conditions. Here is a summary of our discussion and the treatment plan we have outlined:  - Atopic Dermatitis Management:   - Continue using Trimicinolone cream twice daily for two weeks; switch to Tacrolimus if no improvement.   - Consider Dupixent injections for recurrent flares.   - Use Eucerin Advanced Repair lotion after showers and fragrance-free laundry detergent.   - Avoid hot showers and apply lotion within 10 minutes of drying off.  - Post-Inflammatory Hyperpigmentation:   - Protect skin with sunscreen.   - Optional use of lightening creams if desired, effective in about three months.  - Acne Treatment:   - Increase Tretinoin 0.025% application to Monday through Friday nights.   - Start using Clindamycin swabs every morning.   - Begin Doxycycline 100mg  daily for three months to manage deeper lesions, then as needed. Remember to take it with food and use sunscreen.  - Additional Skincare Instructions:   - Do not apply Tretinoin on irritated area the neck; use Triamcinolone for a few days if needed.   - Switch to lighter lotions like Eucerin Advanced Repair during summer instead of Vaseline.  - Follow-Up:   - Schedule a return visit in three months to assess progress and adjust treatments as necessary.  Please follow these instructions carefully and do not hesitate to contact the office if you have any questions or concerns. Looking forward to seeing you at your next appointment.   Due to recent changes in healthcare laws, you may see results of your pathology and/or laboratory studies on MyChart before the doctors have had a chance to review them. We understand that in some cases there may be results that are confusing or concerning to you. Please understand that not all results are received at the same time and often the doctors may need to interpret  multiple results in order to provide you with the best plan of care or course of treatment. Therefore, we ask that you please give [place] 2 business days to thoroughly review all your results before contacting the office for clarification. Should we see a critical lab result, you will be contacted sooner.   If You Need Anything After Your Visit  If you have any questions or concerns for your doctor, please call our main line at [phone] If no one answers, please leave a voicemail as directed and we will return your call as soon as possible. Messages left after 4 pm will be answered the following business day.   You may also send [place] a message via MyChart. We typically respond to MyChart messages within 1-2 business days.  For prescription refills, please ask your pharmacy to contact our office. Our fax number is [phone].  If you have an urgent issue when the clinic is closed that cannot wait until the next business day, you can page your doctor at the number below.    Please note that while we do our best to be available for urgent issues outside of office hours, we are not available 24/7.   If you have an urgent issue and are unable to reach [place], you may choose to seek medical care at your doctor's office, retail clinic, urgent care center, or emergency room.  If you have a medical emergency, please immediately call 911 or go to the emergency department. In the event of inclement weather, please call our main line at [phone]  for an update on the status of any delays or closures.  Dermatology Medication Tips: Please keep the boxes that topical medications come in in order to help keep track of the instructions about where and how to use these. Pharmacies typically print the medication instructions only on the boxes and not directly on the medication tubes.   If your medication is too expensive, please contact our office at [phone] or send [place] a message through MyChart.   We are  unable to tell what your co-pay for medications will be in advance as this is different depending on your insurance coverage. However, we may be able to find a substitute medication at lower cost or fill out paperwork to get insurance to cover a needed medication.   If a prior authorization is required to get your medication covered by your insurance company, please allow [place] 1-2 business days to complete this process.  Drug prices often vary depending on where the prescription is filled and some pharmacies may offer cheaper prices.  The website www.goodrx.com contains coupons for medications through different pharmacies. The prices here do not account for what the cost may be with help from insurance (it may be cheaper with your insurance), but the website can give you the price if you did not use any insurance.  - You can print the associated coupon and take it with your prescription to the pharmacy.  - You may also stop by our office during regular business hours and pick up a GoodRx coupon card.  - If you need your prescription sent electronically to a different pharmacy, notify our office through [facility] or by phone at [phone]

## 2022-09-22 NOTE — Progress Notes (Signed)
   Follow-Up Visit   Subjective  George Haney is a 22 y.o. male who presents for the following: Atopic Dermatist & Acne  Patient present today for follow up visit for Atopic Dermatisit. Patient was last evaluated on 09/03/2022. Patient reports sxs are better. Patient reports TMC, Adapalene,Tretinoin medication changes.   The following portions of the chart were reviewed this encounter and updated as appropriate: medications, allergies, medical history  Review of Systems:  No other skin or systemic complaints except as noted in HPI or Assessment and Plan.  Objective  Well appearing patient in no apparent distress; mood and affect are within normal limits.  Relevant exam findings are noted in the Assessment and Plan.  Face: inflammatory papules and comedone  Body: brown patches in areas of prior eczema flare                    Pathology Report Diagnosis 1. Skin , right thigh - anterior PSORIASIFORM AND SPONGIOTIC DERMATITIS, SEE DESCRIPTION 2. Skin , mid back PSORIASIFORM AND SPONGIOTIC DERMATITIS, SEE DESCRIPTION Microscopic Description 1. & 2. 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 Infiltrates of lymphocytes and eosinophils are present within the dermis. 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 These changes may be seen in a variety of settings, including contact dermatitis, nummular dermatitis and also atopic dermatitis. Seborrheic dermatitis is also a consideration histologically. A special stain (PAS-F) is performed to determine the presence or absence of fungal hyphae in this biopsy. The PAS stain is negative for fungal hyphae, and the controls stained appropriately.    Assessment & Plan    ATOPIC DERMATITIS  Treatment Plan: - Biopsy confirmed atopic dermatitis, not a fungal infection or other infection.  Reviewed pathology report in detail with pt and mom - Flare resolved, with post-inflammatory hyperpigmentation  present. - At the time of the flare, Start TMC BID for 2 weeks then STOP -While taking a break use Tacrolimus BID for 2 weeks then STOP - Recommended taking warm showers and using DOVE all over the body - Recommended keeping body moisturized with unscented lotions to prevent triggering flares (Eucerin Advanced repair) - Recommended using fragrance free detergents when washing clothes  -Monitor for potential food triggers (tree nuts, dairy, wheat).   ACNE VULGARIS  Treatment Plan: - Increase Tretinoin every night, if you notice irration decrease to using 3-4 nights weekly, take a break during the weekends -We will prescribe Clindamycin Swabs after in the morning - We will also prescribe oral doxycycline 100 mg, to take daily with heavy meal to prevent upset stomach    POST-INFLAMMATORY HYPERPIGMENTATION (PIH)   This is a benign condition that comes from having previous inflammation in the skin and will fade with time over months to sometimes years. Recommend daily sun protection including sunscreen SPF 30+ to sun-exposed areas. - Recommend treating any itchy or red areas on the skin quickly to prevent new areas of PIH. Treating with prescription medicines such as hydroquinone may help fade dark spots faster.    Treatment Plan: - While out in sun exposure make sure you are using sunscreen  Recommend gentle skin care.  Return in about 3 months (around 12/23/2022) for Acne & Eczema F/U.  Documentation: I have reviewed the above documentation for accuracy and completeness, and I agree with the above.  Stasia Cavalier, am acting as scribe for Langston Reusing, DO.   Langston Reusing, DO

## 2022-10-28 ENCOUNTER — Telehealth: Payer: Self-pay

## 2022-10-28 NOTE — Telephone Encounter (Signed)
Mom called on behalf of the patient. She states Triamcinolone cream is no longer working on his skin and wants an alternative sent to the pharmacy.

## 2022-10-28 NOTE — Telephone Encounter (Signed)
Please provide more details.  Pt's diagnosis, areas being treated, their last appointment and other medications tried.  Thanks!

## 2022-10-29 ENCOUNTER — Other Ambulatory Visit: Payer: Self-pay

## 2022-10-29 MED ORDER — TACROLIMUS 0.1 % EX OINT: TOPICAL_OINTMENT | Freq: Two times a day (BID) | CUTANEOUS | 2 refills | Status: DC

## 2022-10-29 MED ORDER — CLOBETASOL PROPIONATE 0.05 % EX CREA: 1.0000 | TOPICAL_CREAM | Freq: Two times a day (BID) | CUTANEOUS | 2 refills | Status: DC

## 2022-10-29 NOTE — Progress Notes (Signed)
I spoke to Pt and let him know medications were sent to his pharmacy.

## 2022-10-29 NOTE — Telephone Encounter (Signed)
OK Thanks!  We can send over clobetasol cream to affected areas to be used BID for 2 weeks and then switch to Tacrolimus BID for 2 weeks if the area still fully clear.  He will alternate between clobetasol and tacrolimus until clear.

## 2022-11-06 ENCOUNTER — Telehealth: Payer: Self-pay

## 2022-11-12 ENCOUNTER — Other Ambulatory Visit: Payer: Self-pay | Admitting: Dermatology

## 2022-11-13 NOTE — Telephone Encounter (Signed)
Mom called into office stating pt is experiencing excessive burning his skin after immediately after using the Tacrolimus ointment. She stated pt tried to take a shower to wash off the medication and when he got out he immediately applied the Clobetasol cream which caused excessive burning as well. I reviewed pts chart with Dr. Onalee Hua and Dr. Onalee Hua recommended stopping the Tacrolimus ointment and to apply OTC Hydrocortisone until the burning subsides. Once his skin has calmed down he can resume the Clobetasol. We will plan to re-evaluate at his next office visit unless sxs persist then we will work him in as a URGENT Appt. Mom voiced understanding.

## 2022-11-24 ENCOUNTER — Other Ambulatory Visit: Payer: Self-pay

## 2022-11-24 ENCOUNTER — Telehealth: Payer: Self-pay

## 2022-11-24 MED ORDER — TRIAMCINOLONE ACETONIDE 0.1 % EX CREA: 1.0000 | TOPICAL_CREAM | Freq: Two times a day (BID) | CUTANEOUS | 1 refills | Status: DC

## 2022-11-24 NOTE — Telephone Encounter (Signed)
Patient's mother called. He stopped the Tacrolimus and Clobetasol. The tacrolimus was burning when he applied it and the clobetasol aggravated his skin. She said the only thing that has worked so far has been the [facility] cream that he was prescribed at his first appointment. He does not have any more TMC and would like to know if he can have another prescription. He has a follow up appointment 10/8.

## 2022-11-25 NOTE — Telephone Encounter (Signed)
We discussed this yesterday so I'm documented our conversation.  Yes, we can send another rx for triamcinolone until his follow up appointment.

## 2022-11-27 ENCOUNTER — Encounter: Payer: Self-pay | Admitting: *Deleted

## 2022-12-11 ENCOUNTER — Other Ambulatory Visit: Payer: Self-pay

## 2022-12-11 ENCOUNTER — Other Ambulatory Visit: Payer: Self-pay | Admitting: Dermatology

## 2022-12-11 MED ORDER — HALOBETASOL PROPIONATE 0.05 % EX OINT: TOPICAL_OINTMENT | Freq: Two times a day (BID) | CUTANEOUS | 1 refills | Status: DC

## 2022-12-11 MED ORDER — HALOBETASOL PROPIONATE 0.05 % EX CREA: TOPICAL_CREAM | Freq: Two times a day (BID) | CUTANEOUS | 1 refills | Status: DC | PRN

## 2022-12-11 MED ORDER — BETAMETHASONE DIPROPIONATE 0.05 % EX OINT: TOPICAL_OINTMENT | Freq: Two times a day (BID) | CUTANEOUS | 1 refills | Status: DC | PRN

## 2022-12-11 NOTE — Progress Notes (Signed)
Pt called saying he was given a small tube of TAC cream and ran out because he has been having a lot of itching/flare. Per dr. Onalee Hua switched pt to betamethasone oint and told him he could use it twice a day for 2 weeks but would then need to stop and restart the tacrolimus.

## 2022-12-11 NOTE — Progress Notes (Signed)
Betamethasone not covered by insurance so switched to halbetasol oint per dr. Tomasita Morrow informed

## 2022-12-23 ENCOUNTER — Encounter: Payer: Self-pay | Admitting: Dermatology

## 2022-12-23 ENCOUNTER — Ambulatory Visit (INDEPENDENT_AMBULATORY_CARE_PROVIDER_SITE_OTHER): Payer: BC Managed Care – PPO | Admitting: Dermatology

## 2022-12-23 VITALS — BP 98/62 | HR 52

## 2022-12-23 DIAGNOSIS — L700 Acne vulgaris: Secondary | ICD-10-CM

## 2022-12-23 DIAGNOSIS — L810 Postinflammatory hyperpigmentation: Secondary | ICD-10-CM

## 2022-12-23 DIAGNOSIS — L2089 Other atopic dermatitis: Secondary | ICD-10-CM

## 2022-12-23 DIAGNOSIS — L209 Atopic dermatitis, unspecified: Secondary | ICD-10-CM | POA: Diagnosis not present

## 2022-12-23 MED ORDER — DUPIXENT 300 MG/2ML ~~LOC~~ SOPN
600.0000 mg | PEN_INJECTOR | Freq: Once | SUBCUTANEOUS | 0 refills | Status: AC
Start: 2022-12-23 — End: 2022-12-23

## 2022-12-23 MED ORDER — DUPIXENT 300 MG/2ML ~~LOC~~ SOAJ
300.0000 mg | SUBCUTANEOUS | 11 refills | Status: DC
Start: 2022-12-23 — End: 2023-04-13

## 2022-12-23 NOTE — Progress Notes (Signed)
   Follow-Up Visit   Subjective  George Haney is a 22 y.o. male accompanied  who presents for the following: Acne and Eczema  Patient present today for follow up visit for Acne and Eczema. Patient was last evaluated on 09/22/22. Patient reports sxs are unchanged. Patient denies medication changes.  The following portions of the chart were reviewed this encounter and updated as appropriate: medications, allergies, medical history  Review of Systems:  No other skin or systemic complaints except as noted in HPI or Assessment and Plan.  Objective  Well appearing patient in no apparent distress; mood and affect are within normal limits.  A focused examination was performed of the following areas: Face  Relevant exam findings are noted in the Assessment and Plan.         Assessment & Plan   ACNE VULGARIS Exam: Open comedones and inflammatory papules  Flared  Treatment Plan: - Recommend continue using Tretinoin 0.025% on M-W-F nights, advised to apply Hyaluronic Acid to the face prior to applying Tretinoin  - Recommended Neutrogena Stubborn Texture to wash face daily in the morning - Recommended applying LaRoche Posay Moisturizer  ATOPIC DERMATITIS Exam: Scaly pink papules coalescing to plaques 25% BSA IGA Score 3  Improved but not at goal  Atopic dermatitis (eczema) is a chronic, relapsing, pruritic condition that can significantly affect quality of life. It is often associated with allergic rhinitis and/or asthma and can require treatment with topical medications, phototherapy, or in severe cases biologic injectable medication (Dupixent; Adbry) or Oral JAK inhibitors.  Treatment Plan: - Continue using the Halobetasol PRN for Flares - Recommended using Eucerin Advanced Repair to keep skin hydrated - Recommend gentle skin care -  Dupixent rx sent in to [facility], forms completed while in office today   POST-INFLAMMATORY HYPERPIGMENTATION (PIH) Exam: hyperpigmented macules  and/or patches at face   This is a benign condition that comes from having previous inflammation in the skin and will fade with time over months to sometimes years. Recommend daily sun protection including sunscreen SPF 30+ to sun-exposed areas. - Recommend treating any itchy or red areas on the skin quickly to prevent new areas of PIH. Treating with prescription medicines such as hydroquinone may help fade dark spots faster.    Treatment Plan: -topical retinoid started today will help lift some excess pigment -once skin is acclimated to retinoid we will add in a separate topical lightening agent -recommended broad spectrum SPF30 every day, hats and sun avoidence.     Return We will call for Dupixent Injection Appt, for Dupixent F/U.    Documentation: I have reviewed the above documentation for accuracy and completeness, and I agree with the above.  Stasia Cavalier, am acting as scribe for Langston Reusing, DO.  Langston Reusing, DO

## 2022-12-23 NOTE — Patient Instructions (Addendum)
Hello Arliss,  Thank you for visiting my office today. Your dedication to enhancing your skin health is greatly appreciated. Below is a summary of our discussion and the outlined treatment plan:  - Eczema Management:   - Halobetasol Ointment: Continue application as needed for flare-ups, twice daily for up to two weeks.   - Moisturizing: Daily application of Eucerin Advanced Repair is recommended.   - Shower Temperature: Avoid hot showers to minimize skin irritation.  - Acne Treatment:   - Discontinuation: Stop using clindamycin pads and tretinoin if you experience irritation.   - Cleanser: Begin using Neutrogena Stubborn Texture wash every morning.   - Tretinoin Application: Apply three nights a week. Reduce to two nights a week if irritation occurs.   - Moisturizing After Tretinoin: LaRoche-Posay Toleriane Double Repair moisturizer samples will be provided for use after tretinoin.  - General Skin Care:   - Exfoliation: Incorporate a gentle exfoliating agent into your daily routine to aid in managing and preventing acne.   - Doxycycline: Continue as prescribed until spring, at which point we will reassess.  - Potential Dupixent Treatment:   - Consideration: Dupixent, an injectable medication administered every two weeks, is recommended for long-term eczema management.   - Information: A brochure with more details on Dupixent was provided.  - Follow-Up Care:   - Insurance: The process for insurance approval for Dupixent will begin, expected to take around three weeks.   - Treatment Initiation: Once approved, samples will be provided to start treatment before regular shipments commence.  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 It's crucial to adhere to your treatment regimen for effective management of your conditions. Should you have any questions or require further assistance, do  not hesitate to reach out to our office.  Warm regards,  Dr. Langston Reusing Dermatology        Important Information   Due to recent changes in healthcare laws, you may see results of your pathology and/or laboratory studies on MyChart before the doctors have had a chance to review them. We understand that in some cases there may be results that are confusing or concerning to you. Please understand that not all results are received at the same time and often the doctors may need to interpret multiple results in order to provide you with the best plan of care or course of treatment. Therefore, we ask that you please give [place] 2 business days to thoroughly review all your results before contacting the office for clarification. Should we see a critical lab result, you will be contacted sooner.     If You Need Anything After Your Visit   If you have any questions or concerns for your doctor, please call our main line at [phone]. If no one answers, please leave a voicemail as directed and we will return your call as soon as possible. Messages left after 4 pm will be answered the following business day.    You may also send [place] a message via MyChart. We typically respond to MyChart messages within 1-2 business days.  For prescription refills, please ask your pharmacy to contact our office. Our fax number is [phone].  If you have an urgent issue when the clinic is closed that cannot wait until the next business day, you can page your doctor at the number below.     Please note that while we do our best  to be available for urgent issues outside of office hours, we are not available 24/7.    If you have an urgent issue and are unable to reach [place], you may choose to seek medical care at your doctor's office, retail clinic, urgent care center, or emergency room.   If you have a medical emergency, please immediately call 911 or go to the emergency department. In the event of inclement  weather, please call our main line at [phone] for an update on the status of any delays or closures.  Dermatology Medication Tips: Please keep the boxes that topical medications come in in order to help keep track of the instructions about where and how to use these. Pharmacies typically print the medication instructions only on the boxes and not directly on the medication tubes.   If your medication is too expensive, please contact our office at [phone] or send [place] a message through MyChart.    We are unable to tell what your co-pay for medications will be in advance as this is different depending on your insurance coverage. However, we may be able to find a substitute medication at lower cost or fill out paperwork to get insurance to cover a needed medication.    If a prior authorization is required to get your medication covered by your insurance company, please allow [place] 1-2 business days to complete this process.   Drug prices often vary depending on where the prescription is filled and some pharmacies may offer cheaper prices.   The website www.goodrx.com contains coupons for medications through different pharmacies. The prices here do not account for what the cost may be with help from insurance (it may be cheaper with your insurance), but the website can give you the price if you did not use any insurance.  - You can print the associated coupon and take it with your prescription to the pharmacy.  - You may also stop by our office during regular business hours and pick up a GoodRx coupon card.  - If you need your prescription sent electronically to a different pharmacy, notify our office through [facility] or by phone at [phone]

## 2023-02-11 ENCOUNTER — Telehealth: Payer: Self-pay

## 2023-02-11 NOTE — Telephone Encounter (Signed)
LVMTCB in regards to pt's scheduling pt's Dupixent Delivery.

## 2023-03-04 ENCOUNTER — Encounter: Payer: Self-pay | Admitting: Dermatology

## 2023-03-04 ENCOUNTER — Ambulatory Visit: Payer: BC Managed Care – PPO | Admitting: Dermatology

## 2023-03-04 VITALS — BP 91/55 | HR 69

## 2023-03-04 DIAGNOSIS — L2089 Other atopic dermatitis: Secondary | ICD-10-CM

## 2023-03-04 DIAGNOSIS — L309 Dermatitis, unspecified: Secondary | ICD-10-CM | POA: Diagnosis not present

## 2023-03-04 MED ORDER — DUPILUMAB 300 MG/2ML ~~LOC~~ SOSY
600.0000 mg | PREFILLED_SYRINGE | Freq: Once | SUBCUTANEOUS | Status: AC
  Administered 2023-03-04: 600 mg via SUBCUTANEOUS

## 2023-03-04 NOTE — Patient Instructions (Addendum)
Hello George Haney,  Thank you for visiting [place] today.  Below is a summary of the essential instructions from today's visit:  - Dupixent Autoinjector Use:   - First Click: Press down until you hear the first click, indicating needle insertion.   - Hold: Maintain pressure for 10 seconds until the second click signals the injection is complete.   - Removal: Wait an additional 3 seconds before removing the injector.   - Disposal: Use a sharps container for disposal of the injector.  - Injection Schedule:   - Today: We administered two injections as part of the loading dose.   - Ongoing: Proceed with one injection every two weeks.  - Storage and Handling:   - Refrigeration: Keep the medication refrigerated.   - Preparation: Allow the medication to reach room temperature for about 30 minutes before injecting to minimize discomfort.  - Injection Sites:   - Areas: Safe areas for self-injection include the thighs or abdomen.   - Rotation: Rotate injection sites to prevent skin irritation.  - Injection Care:   - Preparation: Clean the injection area with alcohol prior to injecting.   - Aftercare: It is normal for the injection site to appear slightly pink and elevated temporarily.  - Follow-Up:   - Topical Creams: Continue using any prescribed topical creams.   - Next Appointment: Schedule a follow-up appointment in 4 months, or as needed.  - Additional Information:   - Medication Delivery: Ensure you are home to receive medication deliveries to maintain medication integrity.   - Communication: For any questions or to manage prescriptions, contact us through MyChart.  We hope this summary aids in the effective management of your treatment. Should you have any further questions or require assistance, please do not hesitate to reach out via MyChart.  Warm regards,  Dr. [facility]    Important Information   Due to recent changes in healthcare laws, you may see results of  your pathology and/or laboratory studies on MyChart before the doctors have had a chance to review them. We understand that in some cases there may be results that are confusing or concerning to you. Please understand that not all results are received at the same time and often the doctors may need to interpret multiple results in order to provide you with the best plan of care or course of treatment. Therefore, we ask that you please give [place] 2 business days to thoroughly review all your results before contacting the office for clarification. Should we see a critical lab result, you will be contacted sooner.     If You Need Anything After Your Visit   If you have any questions or concerns for your doctor, please call our main line at [phone]. If no one answers, please leave a voicemail as directed and we will return your call as soon as possible. Messages left after 4 pm will be answered the following business day.    You may also send [place] a message via MyChart. We typically respond to MyChart messages within 1-2 business days.  For prescription refills, please ask your pharmacy to contact our office. Our fax number is [phone].  If you have an urgent issue when the clinic is closed that cannot wait until the next business day, you can page your doctor at the number below.     Please note that while we do our best to be available for urgent issues outside of office hours, we are not available 24/7.  If you have an urgent issue and are unable to reach [place], you may choose to seek medical care at your doctor's office, retail clinic, urgent care center, or emergency room.   If you have a medical emergency, please immediately call 911 or go to the emergency department. In the event of inclement weather, please call our main line at [phone] for an update on the status of any delays or closures.  Dermatology Medication Tips: Please keep the boxes that topical medications come in in order  to help keep track of the instructions about where and how to use these. Pharmacies typically print the medication instructions only on the boxes and not directly on the medication tubes.   If your medication is too expensive, please contact our office at [phone] or send [place] a message through MyChart.    We are unable to tell what your co-pay for medications will be in advance as this is different depending on your insurance coverage. However, we may be able to find a substitute medication at lower cost or fill out paperwork to get insurance to cover a needed medication.    If a prior authorization is required to get your medication covered by your insurance company, please allow [place] 1-2 business days to complete this process.   Drug prices often vary depending on where the prescription is filled and some pharmacies may offer cheaper prices.   The website www.goodrx.com contains coupons for medications through different pharmacies. The prices here do not account for what the cost may be with help from insurance (it may be cheaper with your insurance), but the website can give you the price if you did not use any insurance.  - You can print the associated coupon and take it with your prescription to the pharmacy.  - You may also stop by our office during regular business hours and pick up a GoodRx coupon card.  - If you need your prescription sent electronically to a different pharmacy, notify our office through [facility] or by phone at [phone]

## 2023-03-04 NOTE — Progress Notes (Signed)
   Follow-Up Visit   Subjective  George Haney is a 22 y.o. male accompanied by mom who presents for the following: Dupixent Injection Teaching  Patient present today for follow up visit for  starting Dupixent and  Injection Teaching. Patient was last evaluated on 12/23/22. Patient reports sxs are unchanged. Patient denies medication changes.  The following portions of the chart were reviewed this encounter and updated as appropriate: medications, allergies, medical history  Review of Systems:  No other skin or systemic complaints except as noted in HPI or Assessment and Plan.  Objective  Well appearing patient in no apparent distress; mood and affect are within normal limits.  A focused examination was performed of the following areas: Scaly plaques on arms and legs  Relevant exam findings are noted in the Assessment and Plan.    Assessment & Plan   Eczema - Assessment: Patient initiating biologic therapy via autoinjector for eczema, having never used an autoinjector before. Detailed instructions on administration technique, storage, and potential side effects were provided. Patient successfully administered two loading doses with minimal discomfort (3/10). - Plan:  - Pt educated in detail about proper administration of Dupixent.  - Administer biologic therapy via autoinjector every 2 weeks.     - Continue using topical prescription creams for active areas as needed.   - Follow up in 4 months to monitor for improvement, with expected itch reduction immediately and significant skin clearance within 3 months.   - Utilize MyChart for any questions or issues.   - Expect monthly medication delivery from pharmacy.  OTHER ATOPIC DERMATITIS   Related Medications Dupilumab (DUPIXENT) 300 MG/2ML SOPN Inject 300 mg into the skin every 14 (fourteen) days. Starting at day 15 for maintenance. dupilumab (DUPIXENT) prefilled syringe 600 mg   Return in about 4 months (around 07/03/2023)  for Dupixent F/U.    Documentation: I have reviewed the above documentation for accuracy and completeness, and I agree with the above.  Langston Reusing, DO

## 2023-03-21 ENCOUNTER — Encounter: Payer: Self-pay | Admitting: Dermatology

## 2023-03-25 ENCOUNTER — Ambulatory Visit: Payer: BC Managed Care – PPO | Admitting: Dermatology

## 2023-04-13 ENCOUNTER — Telehealth: Payer: Self-pay

## 2023-04-13 ENCOUNTER — Other Ambulatory Visit: Payer: Self-pay

## 2023-04-13 DIAGNOSIS — L2089 Other atopic dermatitis: Secondary | ICD-10-CM

## 2023-04-13 MED ORDER — DUPIXENT 300 MG/2ML ~~LOC~~ SOAJ: 300.0000 mg | SUBCUTANEOUS | 11 refills | Status: DC

## 2023-04-13 NOTE — Telephone Encounter (Signed)
I spoke with Mrs. Bias (Mom) in regard to medication refill. I advised I sent the refill to [facility]. She requested that the prescription is sent to the local pharmacy where they originally picked up the prescription. I advised she can stop by our office to pick up samples until his rx is ready for pick-up. She stated she will be by to pick up samples this week.

## 2023-04-16 NOTE — Telephone Encounter (Signed)
I spoke with pt's mom to advise we've received Dupixent Samples and they are ready for pick up at her convenience. His mom states she will have him stop by to pick up the samples later today.

## 2023-05-30 ENCOUNTER — Ambulatory Visit (INDEPENDENT_AMBULATORY_CARE_PROVIDER_SITE_OTHER)

## 2023-05-30 ENCOUNTER — Other Ambulatory Visit: Payer: Self-pay

## 2023-05-30 ENCOUNTER — Ambulatory Visit
Admission: RE | Admit: 2023-05-30 | Discharge: 2023-05-30 | Disposition: A | Discharge status: Home / Self Care | Source: Ambulatory Visit | Attending: Family Medicine | Admitting: Family Medicine

## 2023-05-30 VITALS — BP 113/71 | HR 71 | Temp 97.9°F | Resp 16

## 2023-05-30 DIAGNOSIS — M79645 Pain in left finger(s): Secondary | ICD-10-CM

## 2023-05-30 DIAGNOSIS — S63642A Sprain of metacarpophalangeal joint of left thumb, initial encounter: Secondary | ICD-10-CM

## 2023-05-30 MED ORDER — IBUPROFEN 800 MG PO TABS: 800.0000 mg | ORAL_TABLET | Freq: Three times a day (TID) | ORAL | 0 refills | Status: AC

## 2023-05-30 NOTE — ED Triage Notes (Signed)
 Was playing basketball this morning, went to reach for ball, thumb bit another person's leg, and has had pain to left hand since then. Has taken ibuprofen.

## 2023-05-30 NOTE — ED Provider Notes (Signed)
 [facility]    CSN: [id number] Arrival date & time: 05/30/23  1409      History   Chief Complaint Chief Complaint  Patient presents with   Hand Problem    Thumb injury - Entered by patient    HPI George Haney is a 23 y.o. male.   Thumb got bent back while playing basketball today.  He is painful from his thumb up into his hand.  He is has some swelling.  Here for evaluation.   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 Authorizing Provider  ibuprofen (ADVIL) 800 MG tablet Take 1 tablet (800 mg total) by mouth 3 (three) times daily. 05/30/23  Yes Eustace Moore, MD  Dupilumab (DUPIXENT) 300 MG/2ML SOAJ Inject 300 mg into the skin every 14 (fourteen) days. Starting at day 15 for maintenance. 04/13/23   Terri Piedra, DO    Family History Family History  Problem Relation Age of Onset   Diabetes Maternal Grandmother    Heart disease Maternal Grandmother    Hypertension Mother    Hypertension Maternal Grandfather    Cancer Paternal Grandmother        renal cell    Social History Social History   Tobacco Use   Smoking status: Never    Passive exposure: Never   Smokeless tobacco: Never  Substance Use Topics   Alcohol use: No   Drug use: No     Allergies   Clobetasol propionate   Review of Systems Review of Systems See HPI  Physical Exam Triage Vital  Signs ED Triage Vitals  Encounter Vitals Group     BP 05/30/23 1428 113/71     Systolic BP Percentile --      Diastolic BP Percentile --      Pulse Rate 05/30/23 1428 71     Resp 05/30/23 1428 16     Temp 05/30/23 1428 97.9 F (36.6 C)     Temp src --      SpO2 05/30/23 1428 99 %     Weight --      Height --      Head Circumference --      Peak Flow --      Pain Score 05/30/23 1431 4     Pain Loc --      Pain Education --      Exclude from Growth Chart --    No data found.  Updated Vital Signs BP 113/71   Pulse 71   Temp 97.9 F (36.6 C)   Resp 16   SpO2 99%       Physical Exam Constitutional:      General: He is not in acute distress.    Appearance: He is well-developed.  HENT:     Head: Normocephalic and atraumatic.  Eyes:  Conjunctiva/sclera: Conjunctivae normal.     Pupils: Pupils are equal, round, and reactive to light.  Cardiovascular:     Rate and Rhythm: Normal rate.  Pulmonary:     Effort: Pulmonary effort is normal. No respiratory distress.  Abdominal:     General: There is no distension.     Palpations: Abdomen is soft.  Musculoskeletal:        General: Swelling and tenderness present. Normal range of motion.     Cervical back: Normal range of motion.     Comments: Left thumb has swelling in the thenar eminence.  Tenderness around the MCP joint.  Patient has no problems with flexion and extension but pain with abduction maneuvers at the joint.  Question for laxity.  Skin:    General: Skin is warm and dry.  Neurological:     Mental Status: He is alert.      UC Treatments / Results  Labs (all labs ordered are listed, but only abnormal results are displayed) Labs Reviewed - No data to display  EKG   Radiology DG Hand Complete Left Result Date: 05/30/2023 CLINICAL DATA:  Thumb hit his leg. Thumb pain since. Injury this morning while playing basketball. EXAM: LEFT HAND - COMPLETE 3+ VIEW COMPARISON:  None Available. FINDINGS: No fracture  or bone lesion. Joints are normally spaced and aligned. Soft tissues are unremarkable. IMPRESSION: Negative. Electronically Signed   By: Amie Portland M.D.   On: 05/30/2023 15:11    Procedures Procedures (including critical care time)  Medications Ordered in UC Medications - No data to display  Initial Impression / Assessment and Plan / UC Course  I have reviewed the triage vital signs and the nursing notes.  Pertinent labs & imaging results that were available during my care of the patient were reviewed by me and considered in my medical decision making (see chart for details).     Explained to the patient that I do not see any fractures but he could have a ligament injury.  I recommend that we brace ice anti-inflammatory, and have him follow-up with a sports medicine specialist. Final Clinical Impressions(s) / UC Diagnoses   Final diagnoses:  Sprain of metacarpophalangeal (MCP) joint of left thumb, initial encounter     Discharge Instructions      Use ice for 20 minutes every couple of hours Use brace as much as tolerated.  May remove for bathing and ice treatment Take ibuprofen 800 mg 3 times a day with food. Call Dr. Margaretha Sheffield, [facility] health sports medicine in [facility], Monday morning for an appointment next week  X rays are NEGATIVE for fracture     ED Prescriptions     Medication Sig Dispense Auth. Provider   ibuprofen (ADVIL) 800 MG tablet Take 1 tablet (800 mg total) by mouth 3 (three) times daily. 21 tablet Eustace Moore, MD      PDMP not reviewed this encounter.   Eustace Moore, MD 05/30/23 [phone]

## 2023-05-30 NOTE — Discharge Instructions (Addendum)
 Use ice for 20 minutes every couple of hours Use brace as much as tolerated.  May remove for bathing and ice treatment Take ibuprofen 800 mg 3 times a day with food. Call Dr. Margaretha Sheffield, [facility] health sports medicine in [facility], Monday morning for an appointment next week  X rays are NEGATIVE for fracture

## 2023-07-07 ENCOUNTER — Encounter: Payer: Self-pay | Admitting: Dermatology

## 2023-07-07 ENCOUNTER — Ambulatory Visit (INDEPENDENT_AMBULATORY_CARE_PROVIDER_SITE_OTHER): Payer: BC Managed Care – PPO | Admitting: Dermatology

## 2023-07-07 VITALS — BP 108/66 | HR 57

## 2023-07-07 DIAGNOSIS — L810 Postinflammatory hyperpigmentation: Secondary | ICD-10-CM | POA: Diagnosis not present

## 2023-07-07 DIAGNOSIS — L700 Acne vulgaris: Secondary | ICD-10-CM

## 2023-07-07 DIAGNOSIS — L2089 Other atopic dermatitis: Secondary | ICD-10-CM

## 2023-07-07 MED ORDER — TACROLIMUS 0.1 % EX OINT: TOPICAL_OINTMENT | Freq: Two times a day (BID) | CUTANEOUS | 8 refills | Status: AC

## 2023-07-07 MED ORDER — DUPIXENT 300 MG/2ML ~~LOC~~ SOAJ: 300.0000 mg | SUBCUTANEOUS | 11 refills | Status: AC

## 2023-07-07 MED ORDER — CLINDAMYCIN PHOSPHATE 1 % EX SWAB: 1.0000 | Freq: Every morning | CUTANEOUS | 6 refills | Status: AC

## 2023-07-07 MED ORDER — TRIAMCINOLONE ACETONIDE 0.1 % EX CREA: TOPICAL_CREAM | Freq: Two times a day (BID) | CUTANEOUS | 5 refills | Status: AC

## 2023-07-07 NOTE — Progress Notes (Signed)
 Follow-Up Visit   Subjective  George Haney is a 23 y.o. male who presents for the following: Eczema on Dupixent  Therapy & Acne  Patient present today for follow up visit for eczema. Patient was last evaluated on 03/04/23. At this visit patient was initiated on Dupixent  injection. Patient reports his last injection date was 06/19/23. Patient reports sxs are  improved  after starting Dupixent .  He states he has maintained clear skin for the majority of his time while he has been on Dupixent  Therapy. He states he may flared close to the next injections, so he will apply TMC but the flares doesn't last. He rates his itch 1 out of 10 compared to 8.5 out of 10 at previous ov. He does have concerns about   Patient is also here today to follow for acne. Patient is currently prescribed Tretinoin  0.025% to apply to the face 3 nights week. Patient reports his sxs are well controlled. He is still having breakouts. He states he is currently washing with African Black soap because Panoxly caused his skin to feel dry and he moisturizes with Grape seed oil. Patient denies medication changes.  The following portions of the chart were reviewed this encounter and updated as appropriate: medications, allergies, medical history  Review of Systems:  No other skin or systemic complaints except as noted in HPI or Assessment and Plan.  Objective  Well appearing patient in no apparent distress; mood and affect are within normal limits.  A full examination was performed including scalp, head, eyes, ears, nose, lips, neck, chest, axillae, abdomen, back, buttocks, bilateral upper extremities, bilateral lower extremities, hands, feet, fingers, toes, fingernails, and toenails. All findings within normal limits unless otherwise noted below.   Relevant exam findings are noted in the Assessment and Plan.             Assessment & Plan   1. Eczema - Assessment: Improved eczema control with current treatment regimen,  including Dupixent  injections and triamcinolone  cream for breakthrough episodes. Breakthrough occurrences happen just before the next injection. Condition persistent throughout the year. Patient using grape seed oil as a moisturizer for the body.  - Plan:    Continue Dupixent  injections (current regimen)    Refill triamcinolone  0.1% cream, 80-gram tube with 5 refills    Apply triamcinolone  cream to affected areas during breakthrough episodes    Continue using grape seed oil as a moisturizer for the body, avoiding application to the face    Initiate tacrolimus  ointment, alternating with triamcinolone  cream (2 weeks each) for 2 months    Follow up in September  2. Acne and PIH - Assessment: Acne improved but not fully cleared. Current regimen includes tretinoin . Previously included doxycycline , effectiveness not recalled. Examination reveals primarily clogged pores with a few deep cysts. Condition affecting face and potentially leaving dark spots. - Plan:    Increase tretinoin  use to 5 nights per week (Monday through Friday)    Initiate low-dose doxycycline  (approximately 50 mg daily)    Start clindamycin  swabs for morning application    Avoid using grape seed oil on the face    Continue using black soap for cleansing    Follow up in September    Patient instructed to send messages and pictures if severe flare occurs while away  3. Tattoo Planning - Assessment: Patient inquires about getting a tattoo on arm affected by eczema. Flares occur approximately once a month, primarily on the back of the arm. Planning a sleeve tattoo extending to  the shoulder.  - Plan:    Advised to maintain clear skin for 2 months before getting a tattoo    Instructed to use tacrolimus  ointment after getting the tattoo to prevent flares    Cautioned about the risk of tattooing on actively inflamed skin    Continue with prescribed eczema management plan to stabilize skin condition   No follow-ups on file.  I,  Jetta Ager, am acting as Neurosurgeon for [employer], DO.   Documentation: I have reviewed the above documentation for accuracy and completeness, and I agree with the above.  Louana Roup, DO

## 2023-07-07 NOTE — Patient Instructions (Addendum)
 Dear George Haney,  Thank you for visiting today. Here is a summary of the key instructions:  - Medications:   - Use triamcinolone  cream for eczema flare-ups for 2 weeks, alternate with Tacrolimus  ointment  twice a day for 2 weeks.  Keep alternating until clear   - Apply tretinoin  5 nights a week (Monday through Friday) for acne   - Start taking low-dose doxycycline  (50 mg) for acne   - Use clindamycin  swabs every morning for acne   - Skin Care:   - Do not use grapeseed oil on your face   - Continue using grapeseed oil on your body (chest down)   - Keep using black soap if it feels good for you   - Moisturize with lighter products on your face  - Eczema Management: for arm where wanting a new tattoo   - Alternate between triamcinolone  and tacrolimus  creams:     - Use triamcinolone  for 2 weeks     - Then use tacrolimus  for 2 weeks     - Repeat this cycle for 2 months  - Tattoo Considerations:   - Wait until your skin is clear for 2 months before getting a new tattoo   - After getting a tattoo, use tacrolimus  cream to prevent flare-ups  - Follow-up:   - Next appointment in September   - Send a message with pictures if you get a terrible acne flare while away  Please reach out if you have any questions or concerns.  Warm regards,  Dr. [facility]      Important Information  Due to recent changes in healthcare laws, you may see results of your pathology and/or laboratory studies on MyChart before the doctors have had a chance to review them. We understand that in some cases there may be results that are confusing or concerning to you. Please understand that not all results are received at the same time and often the doctors may need to interpret multiple results in order to provide you with the best plan of care or course of treatment. Therefore, we ask that you please give us  2 business days to thoroughly review all your results before contacting the office for  clarification. Should we see a critical lab result, you will be contacted sooner.   If You Need Anything After Your Visit  If you have any questions or concerns for your doctor, please call our main line at [phone] If no one answers, please leave a voicemail as directed and we will return your call as soon as possible. Messages left after 4 pm will be answered the following business day.   You may also send us  a message via MyChart. We typically respond to MyChart messages within 1-2 business days.  For prescription refills, please ask your pharmacy to contact our office. Our fax number is [phone].  If you have an urgent issue when the clinic is closed that cannot wait until the next business day, you can page your doctor at the number below.    Please note that while we do our best to be available for urgent issues outside of office hours, we are not available 24/7.   If you have an urgent issue and are unable to reach us , you may choose to seek medical care at your doctor's office, retail clinic, urgent care center, or emergency room.  If you have a medical emergency, please immediately call 911 or go to the emergency department. In the event of inclement weather,  please call our main line at [phone] for an update on the status of any delays or closures.  Dermatology Medication Tips: Please keep the boxes that topical medications come in in order to help keep track of the instructions about where and how to use these. Pharmacies typically print the medication instructions only on the boxes and not directly on the medication tubes.   If your medication is too expensive, please contact our office at [phone] or send us  a message through MyChart.   We are unable to tell what your co-pay for medications will be in advance as this is different depending on your insurance coverage. However, we may be able to find a substitute medication at lower cost or fill out paperwork to  get insurance to cover a needed medication.   If a prior authorization is required to get your medication covered by your insurance company, please allow us  1-2 business days to complete this process.  Drug prices often vary depending on where the prescription is filled and some pharmacies may offer cheaper prices.  The website www.goodrx.com contains coupons for medications through different pharmacies. The prices here do not account for what the cost may be with help from insurance (it may be cheaper with your insurance), but the website can give you the price if you did not use any insurance.  - You can print the associated coupon and take it with your prescription to the pharmacy.  - You may also stop by our office during regular business hours and pick up a GoodRx coupon card.  - If you need your prescription sent electronically to a different pharmacy, notify our office through [facility] or by phone at [phone]

## 2023-12-04 ENCOUNTER — Encounter: Payer: Self-pay | Admitting: *Deleted

## 2023-12-07 ENCOUNTER — Ambulatory Visit: Admitting: Dermatology
